# Patient Record
Sex: Male | Born: 1980 | Race: Black or African American | Hispanic: No | Marital: Single
Health system: Southern US, Community
[De-identification: ages and names within clinical notes are randomized; demographics above are authoritative.]

---

## 2004-08-27 ENCOUNTER — Emergency Department (HOSPITAL_COMMUNITY): Admission: EM | Admit: 2004-08-27 | Discharge: 2004-08-27 | Payer: Self-pay | Admitting: Emergency Medicine

## 2006-01-12 ENCOUNTER — Emergency Department (HOSPITAL_COMMUNITY): Admission: EM | Admit: 2006-01-12 | Discharge: 2006-01-12 | Payer: Self-pay | Admitting: Emergency Medicine

## 2006-11-30 ENCOUNTER — Emergency Department (HOSPITAL_COMMUNITY): Admission: EM | Admit: 2006-11-30 | Discharge: 2006-12-01 | Payer: Self-pay | Admitting: Emergency Medicine

## 2007-05-19 ENCOUNTER — Emergency Department (HOSPITAL_COMMUNITY): Admission: EM | Admit: 2007-05-19 | Discharge: 2007-05-19 | Payer: Self-pay | Admitting: Emergency Medicine

## 2007-05-25 ENCOUNTER — Emergency Department (HOSPITAL_COMMUNITY): Admission: EM | Admit: 2007-05-25 | Discharge: 2007-05-25 | Payer: Self-pay | Admitting: Emergency Medicine

## 2007-08-23 ENCOUNTER — Emergency Department (HOSPITAL_COMMUNITY): Admission: EM | Admit: 2007-08-23 | Discharge: 2007-08-24 | Payer: Self-pay | Admitting: Emergency Medicine

## 2008-04-27 ENCOUNTER — Emergency Department (HOSPITAL_COMMUNITY): Admission: EM | Admit: 2008-04-27 | Discharge: 2008-04-27 | Payer: Self-pay | Admitting: Emergency Medicine

## 2008-12-04 IMAGING — CR DG FOREARM 2V*R*
1 series · 1 of 1 positions shown · non-contrast
Comparison: None.

RIGHT FOREARM - 2  VIEW:

CLINICAL DATA: Laceration

[x forearm ap right]
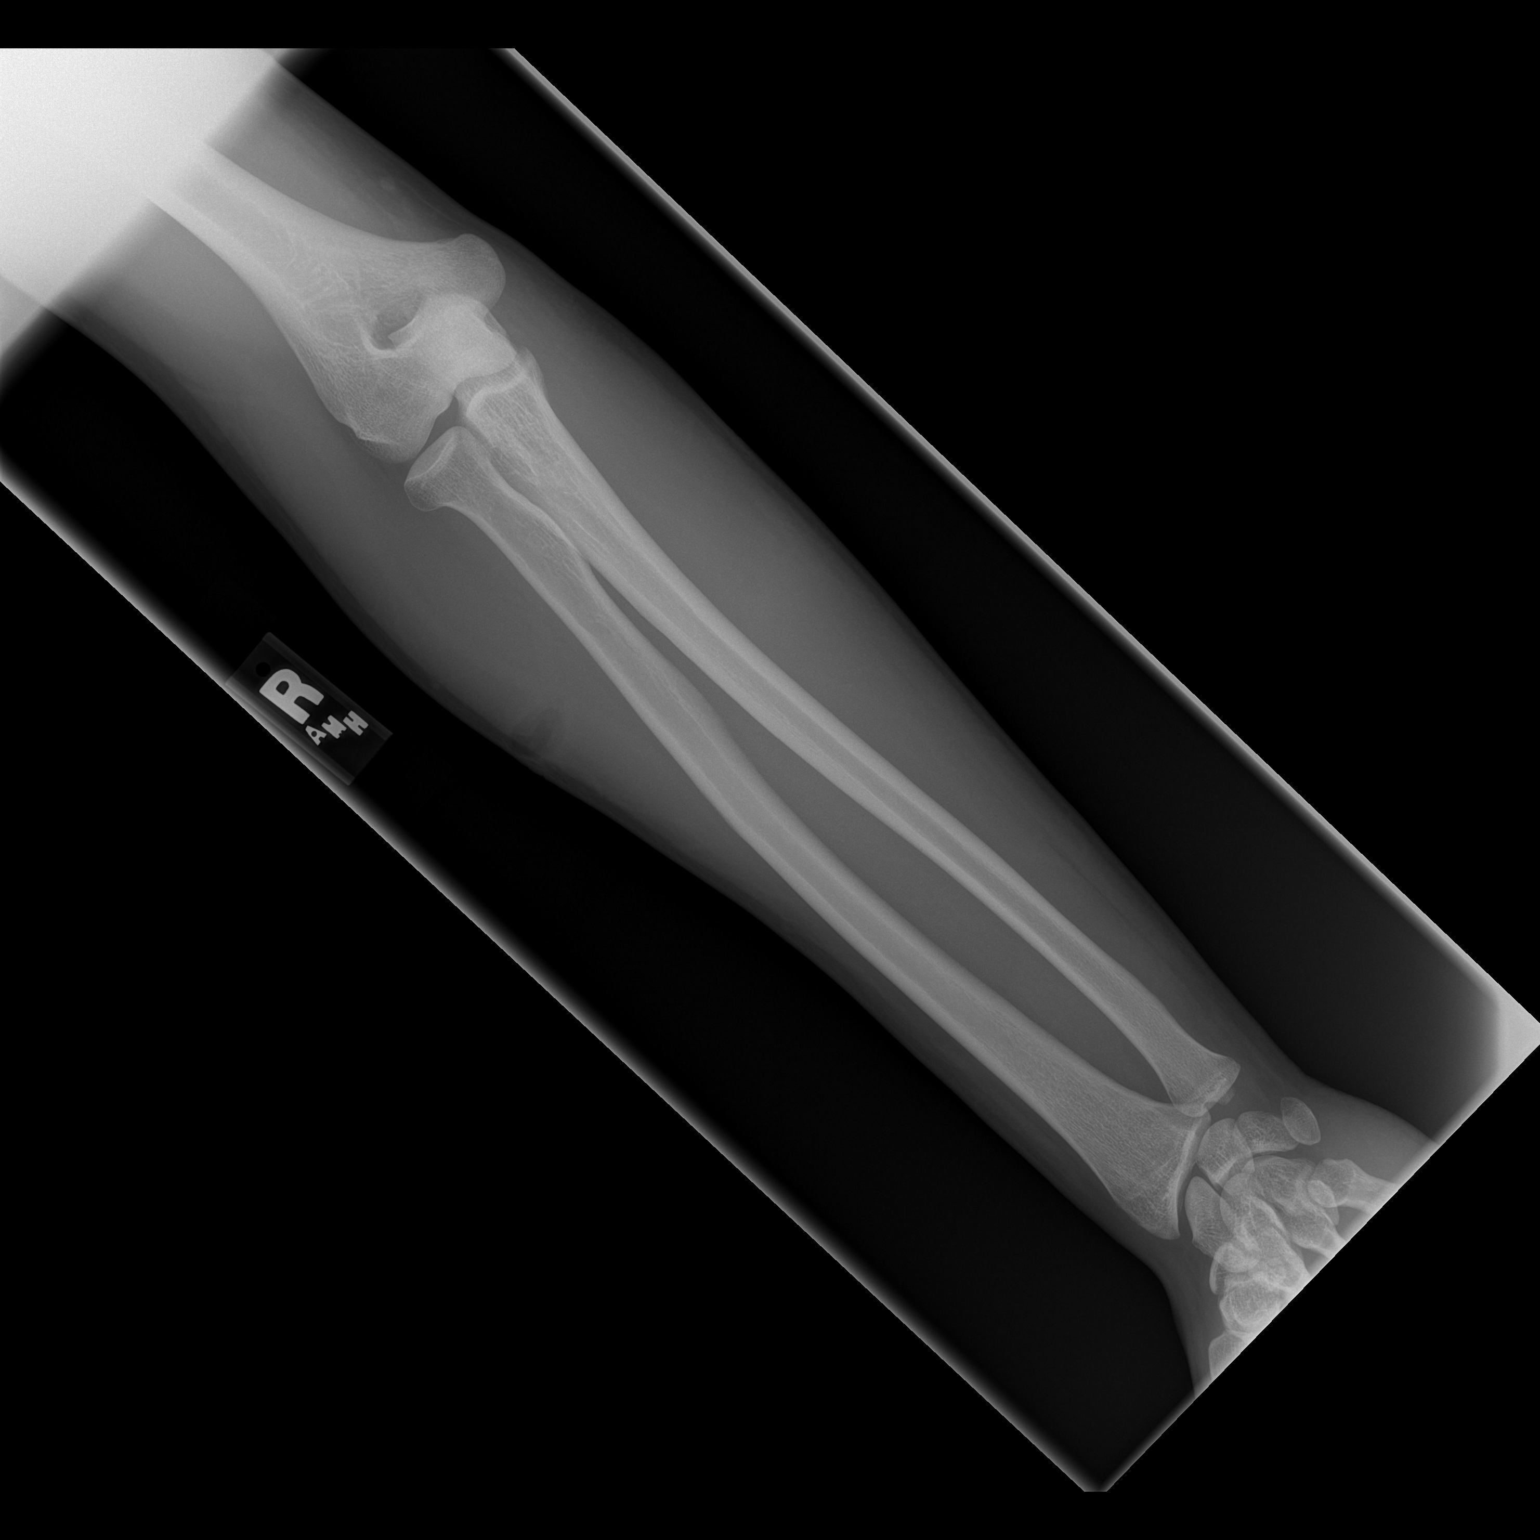

[1 of 1 positions shown; findings below may reference images not displayed]

FINDINGS: No evidence for acute fracture. No focal bony abnormality. Soft
tissue laceration is seen over the antero- lateral proximal forearm. No evidence
for retained radiopaque foreign body although glass can be occult on x-ray.
IMPRESSION: No acute bony findings.

No retained radiopaque foreign body.

## 2017-11-01 SURGERY — CYSTOSCOPY/URETEROSCOPY/HOLMIUM LASER/STENT PLACEMENT
Anesthesia: General

## 2018-01-08 ENCOUNTER — Telehealth: Payer: Self-pay | Admitting: Gastroenterology

## 2018-01-08 ENCOUNTER — Encounter: Payer: Self-pay | Admitting: Gastroenterology

## 2018-01-08 NOTE — Telephone Encounter (Signed)
Called #'s listed in system and the lady that answered the phone said "I had the wrong number" I will send a letter to patient to call and schedule follow up appointment

## 2018-01-08 NOTE — Telephone Encounter (Signed)
Please see note re: schedule follow up appointment with Dr. Myrtie Neitheranis.

## 2018-01-08 NOTE — Telephone Encounter (Signed)
Please contact this patient to schedule an appointment with me (anytime in next 6-8 weeks)and plan his repeat colonoscopy.  He had his colon cancer resected in January, and I rec'd a follow up note from his surgeon Dr. Derrell LollingIngram.  - HD
